# Patient Record
Sex: Female | Born: 1995
Health system: Southern US, Community
[De-identification: ages and names within clinical notes are randomized; demographics above are authoritative.]

## PROBLEM LIST (undated history)

## (undated) DIAGNOSIS — S060XAA Concussion with loss of consciousness status unknown, initial encounter: Secondary | ICD-10-CM

## (undated) DIAGNOSIS — F3131 Bipolar disorder, current episode depressed, mild: Secondary | ICD-10-CM

## (undated) DIAGNOSIS — S060X9A Concussion with loss of consciousness of unspecified duration, initial encounter: Secondary | ICD-10-CM

## (undated) HISTORY — PX: TONSILLECTOMY: SUR1361

## (undated) HISTORY — PX: ADENOIDECTOMY: SUR15

---
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
## 2013-11-01 ENCOUNTER — Other Ambulatory Visit (HOSPITAL_COMMUNITY)
Admission: RE | Admit: 2013-11-01 | Discharge: 2013-11-01 | Disposition: A | Discharge status: Home / Self Care | Payer: 59 | Source: Ambulatory Visit | Attending: Obstetrics & Gynecology | Admitting: Obstetrics & Gynecology

## 2013-11-01 DIAGNOSIS — Z113 Encounter for screening for infections with a predominantly sexual mode of transmission: Secondary | ICD-10-CM | POA: Insufficient documentation

## 2013-11-01 DIAGNOSIS — Z01419 Encounter for gynecological examination (general) (routine) without abnormal findings: Secondary | ICD-10-CM | POA: Insufficient documentation

## 2013-12-20 ENCOUNTER — Ambulatory Visit: Payer: Self-pay

## 2013-12-20 ENCOUNTER — Emergency Department: Payer: Self-pay | Admitting: Emergency Medicine

## 2013-12-20 LAB — COMPREHENSIVE METABOLIC PANEL
ALT: 36 U/L (ref 12–78)
AST: 47 U/L — AB (ref 15–37)
Albumin: 4.1 g/dL (ref 3.4–5.0)
Alkaline Phosphatase: 81 U/L
Anion Gap: 5 — ABNORMAL LOW (ref 7–16)
BUN: 11 mg/dL (ref 7–18)
Bilirubin,Total: 0.7 mg/dL (ref 0.2–1.0)
CALCIUM: 9.5 mg/dL (ref 8.5–10.1)
Chloride: 105 mmol/L (ref 98–107)
Co2: 26 mmol/L (ref 21–32)
Creatinine: 0.83 mg/dL (ref 0.60–1.30)
EGFR (African American): >60
EGFR (Non-African Amer.): >60
GLUCOSE: 90 mg/dL (ref 65–99)
Osmolality: 271 (ref 275–301)
POTASSIUM: 4 mmol/L (ref 3.5–5.1)
SODIUM: 136 mmol/L (ref 136–145)
TOTAL PROTEIN: 8.1 g/dL (ref 6.4–8.2)

## 2013-12-20 LAB — URINALYSIS, COMPLETE
Bilirubin,UR: NEGATIVE
Blood: NEGATIVE
Glucose,UR: NEGATIVE mg/dL (ref 0–75)
KETONE: NEGATIVE
NITRITE: NEGATIVE
PROTEIN: NEGATIVE
Ph: 7 (ref 4.5–8.0)
SPECIFIC GRAVITY: 1.004 (ref 1.003–1.030)
Squamous Epithelial: 3

## 2013-12-20 LAB — DRUG SCREEN, URINE

## 2013-12-20 LAB — CBC WITH DIFFERENTIAL/PLATELET
Basophil #: 0 10*3/uL (ref 0.0–0.1)
Basophil %: 0.4 %
EOS PCT: 0.3 %
Eosinophil #: 0 10*3/uL (ref 0.0–0.7)
HCT: 44.5 % (ref 35.0–47.0)
HGB: 14.7 g/dL (ref 12.0–16.0)
LYMPHS ABS: 1.3 10*3/uL (ref 1.0–3.6)
LYMPHS PCT: 14.5 %
MCH: 29.5 pg (ref 26.0–34.0)
MCHC: 33.1 g/dL (ref 32.0–36.0)
MCV: 89 fL (ref 80–100)
MONO ABS: 0.5 x10 3/mm (ref 0.2–0.9)
MONOS PCT: 6.1 %
NEUTROS ABS: 7.1 10*3/uL — AB (ref 1.4–6.5)
Neutrophil %: 78.7 %
PLATELETS: 323 10*3/uL (ref 150–440)
RBC: 4.99 10*6/uL (ref 3.80–5.20)
RDW: 13.1 % (ref 11.5–14.5)
WBC: 9 10*3/uL (ref 3.6–11.0)

## 2014-01-02 ENCOUNTER — Encounter (HOSPITAL_COMMUNITY): Payer: Self-pay | Admitting: Emergency Medicine

## 2014-01-02 ENCOUNTER — Emergency Department (HOSPITAL_COMMUNITY)
Admission: EM | Admit: 2014-01-02 | Discharge: 2014-01-02 | Disposition: A | Discharge status: Home / Self Care | Payer: 59 | Attending: Emergency Medicine | Admitting: Emergency Medicine

## 2014-01-02 ENCOUNTER — Emergency Department (HOSPITAL_COMMUNITY): Payer: 59

## 2014-01-02 DIAGNOSIS — T450X5A Adverse effect of antiallergic and antiemetic drugs, initial encounter: Secondary | ICD-10-CM | POA: Insufficient documentation

## 2014-01-02 DIAGNOSIS — T4275XA Adverse effect of unspecified antiepileptic and sedative-hypnotic drugs, initial encounter: Secondary | ICD-10-CM

## 2014-01-02 DIAGNOSIS — R51 Headache: Secondary | ICD-10-CM | POA: Insufficient documentation

## 2014-01-02 DIAGNOSIS — R1110 Vomiting, unspecified: Secondary | ICD-10-CM | POA: Insufficient documentation

## 2014-01-02 DIAGNOSIS — T50905A Adverse effect of unspecified drugs, medicaments and biological substances, initial encounter: Secondary | ICD-10-CM

## 2014-01-02 DIAGNOSIS — T426X5A Adverse effect of other antiepileptic and sedative-hypnotic drugs, initial encounter: Secondary | ICD-10-CM | POA: Insufficient documentation

## 2014-01-02 DIAGNOSIS — Z3202 Encounter for pregnancy test, result negative: Secondary | ICD-10-CM | POA: Insufficient documentation

## 2014-01-02 DIAGNOSIS — Z87828 Personal history of other (healed) physical injury and trauma: Secondary | ICD-10-CM | POA: Insufficient documentation

## 2014-01-02 DIAGNOSIS — Z79899 Other long term (current) drug therapy: Secondary | ICD-10-CM | POA: Insufficient documentation

## 2014-01-02 HISTORY — DX: Concussion with loss of consciousness of unspecified duration, initial encounter: S06.0X9A

## 2014-01-02 HISTORY — DX: Concussion with loss of consciousness status unknown, initial encounter: S06.0XAA

## 2014-01-02 LAB — URINALYSIS, ROUTINE W REFLEX MICROSCOPIC
Bilirubin Urine: NEGATIVE
Glucose, UA: NEGATIVE mg/dL
HGB URINE DIPSTICK: NEGATIVE
Ketones, ur: NEGATIVE mg/dL
Leukocytes, UA: NEGATIVE
Nitrite: NEGATIVE
PROTEIN: NEGATIVE mg/dL
Specific Gravity, Urine: 1.009 (ref 1.005–1.030)
Urobilinogen, UA: 0.2 mg/dL (ref 0.0–1.0)
pH: 6.5 (ref 5.0–8.0)

## 2014-01-02 LAB — POC URINE PREG, ED: Preg Test, Ur: NEGATIVE

## 2014-01-02 LAB — RAPID URINE DRUG SCREEN, HOSP PERFORMED
AMPHETAMINES: NOT DETECTED
BARBITURATES: NOT DETECTED
Benzodiazepines: NOT DETECTED
COCAINE: NOT DETECTED
Opiates: NOT DETECTED
TETRAHYDROCANNABINOL: NOT DETECTED

## 2014-01-02 LAB — CBC WITH DIFFERENTIAL/PLATELET
BASOS PCT: 0 % (ref 0–1)
Basophils Absolute: 0 10*3/uL (ref 0.0–0.1)
Eosinophils Absolute: 0.1 10*3/uL (ref 0.0–0.7)
Eosinophils Relative: 1 % (ref 0–5)
HEMATOCRIT: 38.8 % (ref 36.0–46.0)
HEMOGLOBIN: 13.3 g/dL (ref 12.0–15.0)
LYMPHS ABS: 2.3 10*3/uL (ref 0.7–4.0)
Lymphocytes Relative: 30 % (ref 12–46)
MCH: 30.1 pg (ref 26.0–34.0)
MCHC: 34.3 g/dL (ref 30.0–36.0)
MCV: 87.8 fL (ref 78.0–100.0)
MONO ABS: 0.6 10*3/uL (ref 0.1–1.0)
MONOS PCT: 8 % (ref 3–12)
NEUTROS ABS: 4.6 10*3/uL (ref 1.7–7.7)
NEUTROS PCT: 61 % (ref 43–77)
Platelets: 321 10*3/uL (ref 150–400)
RBC: 4.42 MIL/uL (ref 3.87–5.11)
RDW: 12.6 % (ref 11.5–15.5)
WBC: 7.6 10*3/uL (ref 4.0–10.5)

## 2014-01-02 LAB — ETHANOL: Alcohol, Ethyl (B): <11 mg/dL (ref 0–11)

## 2014-01-02 LAB — ACETAMINOPHEN LEVEL: Acetaminophen (Tylenol), Serum: <15 ug/mL (ref 10–30)

## 2014-01-02 LAB — I-STAT CHEM 8, ED
BUN: 6 mg/dL (ref 6–23)
CALCIUM ION: 1.22 mmol/L (ref 1.12–1.23)
CHLORIDE: 101 meq/L (ref 96–112)
CREATININE: 1 mg/dL (ref 0.50–1.10)
GLUCOSE: 85 mg/dL (ref 70–99)
HEMATOCRIT: 42 % (ref 36.0–46.0)
Hemoglobin: 14.3 g/dL (ref 12.0–15.0)
POTASSIUM: 4.2 meq/L (ref 3.7–5.3)
Sodium: 142 mEq/L (ref 137–147)
TCO2: 25 mmol/L (ref 0–100)

## 2014-01-02 LAB — CBG MONITORING, ED: GLUCOSE-CAPILLARY: 85 mg/dL (ref 70–99)

## 2014-01-02 LAB — SALICYLATE LEVEL: Salicylate Lvl: <2 mg/dL — ABNORMAL LOW (ref 2.8–20.0)

## 2014-01-02 MED ORDER — NAPROXEN 375 MG PO TABS: 375.0000 mg | ORAL_TABLET | Freq: Two times a day (BID) | ORAL | Status: DC

## 2014-01-02 MED ORDER — ONDANSETRON 8 MG PO TBDP: ORAL_TABLET | ORAL | Status: DC
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
## 2014-01-03 ENCOUNTER — Other Ambulatory Visit: Payer: Self-pay | Admitting: Obstetrics & Gynecology

## 2014-01-03 DIAGNOSIS — E041 Nontoxic single thyroid nodule: Secondary | ICD-10-CM

## 2014-01-04 ENCOUNTER — Ambulatory Visit
Admission: RE | Admit: 2014-01-04 | Discharge: 2014-01-04 | Disposition: A | Discharge status: Home / Self Care | Payer: 59 | Source: Ambulatory Visit | Attending: Obstetrics & Gynecology | Admitting: Obstetrics & Gynecology

## 2014-01-04 DIAGNOSIS — E041 Nontoxic single thyroid nodule: Secondary | ICD-10-CM

## 2014-05-16 ENCOUNTER — Ambulatory Visit: Payer: Self-pay | Admitting: Emergency Medicine

## 2014-05-16 LAB — URINALYSIS, COMPLETE

## 2014-05-18 LAB — URINE CULTURE

## 2014-07-02 ENCOUNTER — Ambulatory Visit: Payer: Self-pay | Admitting: Physician Assistant

## 2014-07-02 LAB — URINALYSIS, COMPLETE
Bilirubin,UR: NEGATIVE
Blood: NEGATIVE
Glucose,UR: NEGATIVE
Ketone: NEGATIVE
Nitrite: NEGATIVE
Ph: 7 (ref 5.0–8.0)
Protein: NEGATIVE
Specific Gravity: 1.02 (ref 1.000–1.030)
WBC UR: NONE SEEN /HPF (ref 0–5)

## 2014-07-04 LAB — URINE CULTURE

## 2014-11-15 ENCOUNTER — Ambulatory Visit
Admit: 2014-11-15 | Disposition: A | Discharge status: Home / Self Care | Payer: Self-pay | Attending: Internal Medicine | Admitting: Internal Medicine

## 2014-11-15 LAB — URINALYSIS, COMPLETE
BACTERIA: NEGATIVE
Bilirubin,UR: NEGATIVE
Blood: NEGATIVE
Glucose,UR: NEGATIVE
Ketone: NEGATIVE
Leukocyte Esterase: NEGATIVE
Nitrite: NEGATIVE
Ph: 6 (ref 5.0–8.0)
Protein: NEGATIVE
Specific Gravity: 1.005 (ref 1.000–1.030)
WBC UR: NONE SEEN /HPF (ref 0–5)

## 2014-11-15 LAB — CBC WITH DIFFERENTIAL/PLATELET
Basophil #: 0.1 10*3/uL (ref 0.0–0.1)
Basophil %: 0.8 %
EOS ABS: 0.2 10*3/uL (ref 0.0–0.7)
Eosinophil %: 2.1 %
HCT: 38.1 % (ref 35.0–47.0)
HGB: 13 g/dL (ref 12.0–16.0)
Lymphocyte #: 2.5 10*3/uL (ref 1.0–3.6)
Lymphocyte %: 29.5 %
MCH: 29.3 pg (ref 26.0–34.0)
MCHC: 34.1 g/dL (ref 32.0–36.0)
MCV: 86 fL (ref 80–100)
MONO ABS: 0.7 x10 3/mm (ref 0.2–0.9)
Monocyte %: 8.5 %
Neutrophil #: 5.1 10*3/uL (ref 1.4–6.5)
Neutrophil %: 59.1 %
PLATELETS: 286 10*3/uL (ref 150–440)
RBC: 4.43 10*6/uL (ref 3.80–5.20)
RDW: 12.9 % (ref 11.5–14.5)
WBC: 8.6 10*3/uL (ref 3.6–11.0)

## 2014-11-15 LAB — COMPREHENSIVE METABOLIC PANEL
ANION GAP: 8 (ref 7–16)
AST: 95 U/L — AB
Albumin: 3.9 g/dL
Alkaline Phosphatase: 54 U/L
BUN: 9 mg/dL
Bilirubin,Total: 0.6 mg/dL
CALCIUM: 8.6 mg/dL — AB
CHLORIDE: 106 mmol/L
Co2: 23 mmol/L
Creatinine: 0.63 mg/dL
EGFR (African American): >60
Glucose: 93 mg/dL
Potassium: 3.4 mmol/L — ABNORMAL LOW
SGPT (ALT): 49 U/L
SODIUM: 137 mmol/L
Total Protein: 7 g/dL

## 2014-11-15 LAB — ETHANOL: Ethanol: 12 mg/dL

## 2014-11-15 LAB — DRUG SCREEN, URINE
Amphetamines, Ur Screen: NEGATIVE
BARBITURATES, UR SCREEN: NEGATIVE
BENZODIAZEPINE, UR SCRN: NEGATIVE
CANNABINOID 50 NG, UR ~~LOC~~: POSITIVE
COCAINE METABOLITE, UR ~~LOC~~: NEGATIVE
MDMA (ECSTASY) UR SCREEN: NEGATIVE
Methadone, Ur Screen: NEGATIVE
Opiate, Ur Screen: NEGATIVE
Phencyclidine (PCP) Ur S: NEGATIVE
Tricyclic, Ur Screen: NEGATIVE

## 2014-11-15 LAB — HCG, QUANTITATIVE, PREGNANCY

## 2015-06-02 ENCOUNTER — Ambulatory Visit
Admission: EM | Admit: 2015-06-02 | Discharge: 2015-06-02 | Disposition: A | Discharge status: Home / Self Care | Payer: 59 | Attending: Family Medicine | Admitting: Family Medicine

## 2015-06-02 ENCOUNTER — Encounter: Payer: Self-pay | Admitting: Emergency Medicine

## 2015-06-02 DIAGNOSIS — L309 Dermatitis, unspecified: Secondary | ICD-10-CM

## 2015-06-02 DIAGNOSIS — L03211 Cellulitis of face: Secondary | ICD-10-CM

## 2015-06-02 MED ORDER — SULFAMETHOXAZOLE-TRIMETHOPRIM 800-160 MG PO TABS: 1.0000 | ORAL_TABLET | Freq: Two times a day (BID) | ORAL | Status: AC

## 2015-06-02 MED ORDER — CLOTRIMAZOLE-BETAMETHASONE 1-0.05 % EX CREA: 1.0000 "application " | TOPICAL_CREAM | Freq: Two times a day (BID) | CUTANEOUS | Status: AC

## 2015-06-02 MED ORDER — DIPHENHYDRAMINE HCL 2 % EX GEL: CUTANEOUS | Status: AC

## 2015-06-02 NOTE — Discharge Instructions (Signed)
Cellulitis °Cellulitis is an infection of the skin and the tissue beneath it. The infected area is usually red and tender. Cellulitis occurs most often in the arms and lower legs.  °CAUSES  °Cellulitis is caused by bacteria that enter the skin through cracks or cuts in the skin. The most common types of bacteria that cause cellulitis are staphylococci and streptococci. °SIGNS AND SYMPTOMS  °· Redness and warmth. °· Swelling. °· Tenderness or pain. °· Fever. °DIAGNOSIS  °Your health care provider can usually determine what is wrong based on a physical exam. Blood tests may also be done. °TREATMENT  °Treatment usually involves taking an antibiotic medicine. °HOME CARE INSTRUCTIONS  °· Take your antibiotic medicine as directed by your health care provider. Finish the antibiotic even if you start to feel better. °· Keep the infected arm or leg elevated to reduce swelling. °· Apply a warm cloth to the affected area up to 4 times per day to relieve pain. °· Take medicines only as directed by your health care provider. °· Keep all follow-up visits as directed by your health care provider. °SEEK MEDICAL CARE IF:  °· You notice red streaks coming from the infected area. °· Your red area gets larger or turns dark in color. °· Your bone or joint underneath the infected area becomes painful after the skin has healed. °· Your infection returns in the same area or another area. °· You notice a swollen bump in the infected area. °· You develop new symptoms. °· You have a fever. °SEEK IMMEDIATE MEDICAL CARE IF:  °· You feel very sleepy. °· You develop vomiting or diarrhea. °· You have a general ill feeling (malaise) with muscle aches and pains. °  °This information is not intended to replace advice given to you by your health care provider. Make sure you discuss any questions you have with your health care provider. °  °Document Released: 04/14/2005 Document Revised: 03/26/2015 Document Reviewed: 09/20/2011 °Elsevier Interactive  Patient Education ©2016 [employer] °Contact Dermatitis °Dermatitis is redness, soreness, and swelling (inflammation) of the skin. Contact dermatitis is a reaction to certain substances that touch the skin. There are two types of contact dermatitis:  °· Irritant contact dermatitis. This type is caused by something that irritates your skin, such as dry hands from washing them too much. This type does not require previous exposure to the substance for a reaction to occur. This type is more common. °· Allergic contact dermatitis. This type is caused by a substance that you are allergic to, such as a nickel allergy or poison ivy. This type only occurs if you have been exposed to the substance (allergen) before. Upon a repeat exposure, your body reacts to the substance. This type is less common. °CAUSES  °Many different substances can cause contact dermatitis. Irritant contact dermatitis is most commonly caused by exposure to:  °· Makeup.   °· Soaps.   °· Detergents.   °· Bleaches.   °· Acids.   °· Metal salts, such as nickel.   °Allergic contact dermatitis is most commonly caused by exposure to:  °· Poisonous plants.   °· Chemicals.   °· Jewelry.   °· Latex.   °· Medicines.   °· Preservatives in products, such as clothing.   °RISK FACTORS °This condition is more likely to develop in:  °· People who have jobs that expose them to irritants or allergens. °· People who have certain medical conditions, such as asthma or eczema.   °SYMPTOMS  °Symptoms of this condition may occur anywhere on your body where the irritant has touched   you or is touched by you. Symptoms include: °· Dryness or flaking.   °· Redness.   °· Cracks.   °· Itching.   °· Pain or a burning feeling.   °· Blisters. °· Drainage of small amounts of blood or clear fluid from skin cracks. °With allergic contact dermatitis, there may also be swelling in areas such as the eyelids, mouth, or genitals.  °DIAGNOSIS  °This condition is diagnosed with a medical  history and physical exam. A patch skin test may be performed to help determine the cause. If the condition is related to your job, you may need to see an occupational medicine specialist. °TREATMENT °Treatment for this condition includes figuring out what caused the reaction and protecting your skin from further contact. Treatment may also include:  °· Steroid creams or ointments. Oral steroid medicines may be needed in more severe cases. °· Antibiotics or antibacterial ointments, if a skin infection is present. °· Antihistamine lotion or an antihistamine taken by mouth to ease itching. °· A bandage (dressing). °HOME CARE INSTRUCTIONS °Skin Care  °· Moisturize your skin as needed.   °· Apply cool compresses to the affected areas. °· Try taking a bath with: °¨ Epsom salts. Follow the instructions on the packaging. You can get these at your local pharmacy or grocery store. °¨ Baking soda. Pour a small amount into the bath as directed by your health care provider. °¨ Colloidal oatmeal. Follow the instructions on the packaging. You can get this at your local pharmacy or grocery store. °· Try applying baking soda paste to your skin. Stir water into baking soda until it reaches a paste-like consistency. °· Do not scratch your skin. °· Bathe less frequently, such as every other day. °· Bathe in lukewarm water. Avoid using hot water. °Medicines  °· Take or apply over-the-counter and prescription medicines only as told by your health care provider.   °· If you were prescribed an antibiotic medicine, take or apply your antibiotic as told by your health care provider. Do not stop using the antibiotic even if your condition starts to improve. °General Instructions  °· Keep all follow-up visits as told by your health care provider. This is important. °· Avoid the substance that caused your reaction. If you do not know what caused it, keep a journal to try to track what caused it. Write down: °¨ What you eat. °¨ What cosmetic  products you use. °¨ What you drink. °¨ What you wear in the affected area. This includes jewelry. °· If you were given a dressing, take care of it as told by your health care provider. This includes when to change and remove it. °SEEK MEDICAL CARE IF:  °· Your condition does not improve with treatment. °· Your condition gets worse. °· You have signs of infection such as swelling, tenderness, redness, soreness, or warmth in the affected area. °· You have a fever. °· You have new symptoms. °SEEK IMMEDIATE MEDICAL CARE IF:  °· You have a severe headache, neck pain, or neck stiffness. °· You vomit. °· You feel very sleepy. °· You notice red streaks coming from the affected area. °· Your bone or joint underneath the affected area becomes painful after the skin has healed. °· The affected area turns darker. °· You have difficulty breathing. °  °This information is not intended to replace advice given to you by your health care provider. Make sure you discuss any questions you have with your health care provider. °  °Document Released: 07/02/2000 Document Revised: 03/26/2015 Document Reviewed: 11/20/2014 °Elsevier Interactive Patient   Education ©2016 [employer]. ° °

## 2015-06-02 NOTE — ED Notes (Signed)
Patient c/o tenderness and redness on the side of her nose for the past 2 days.  Patient c/o bug bites on the back of her neck for a week.

## 2015-06-04 NOTE — ED Provider Notes (Signed)
CSN: [id number]     Arrival date & time 06/02/15  1646 History   First MD Initiated Contact with Patient 06/02/15 1743     Chief Complaint  Patient presents with  . Cellulitis   (Consider location/radiation/quality/duration/timing/severity/associated sxs/prior Treatment) HPI Comments: Single caucasian female here for evaluation of purulent discharge from around nose ring left nostril got caught when taking off shirt last week has been applying OTC topicals including tea tree oil without improvement redness and pain spreading to larger area.  Pets treated for fleas thinks flea bits on back of head not responding to benadryl spray and tea tree oil. Sometimes itchy started out as a few bumps in hair now enlarging.  The history is provided by the patient.    Past Medical History  Diagnosis Date  . Concussion    Past Surgical History  Procedure Laterality Date  . Tonsillectomy    . Adenoidectomy     History reviewed. No pertinent family history. Social History  Substance Use Topics  . Smoking status: Never Smoker   . Smokeless tobacco: None  . Alcohol Use: Yes   OB History    No data available     Review of Systems  Constitutional: Negative for fever, chills, diaphoresis, activity change, appetite change and fatigue.  HENT: Negative for congestion, ear pain, mouth sores, nosebleeds, sore throat, trouble swallowing and voice change.   Eyes: Negative for pain and discharge.  Respiratory: Negative for cough and wheezing.   Cardiovascular: Negative for chest pain and leg swelling.  Gastrointestinal: Negative for nausea, vomiting, diarrhea, constipation and blood in stool.  Genitourinary: Negative for dysuria, hematuria and difficulty urinating.  Musculoskeletal: Negative for myalgias, back pain, joint swelling, arthralgias, gait problem, neck pain and neck stiffness.  Skin: Positive for color change and rash. Negative for pallor and wound.  Allergic/Immunologic: Negative for  environmental allergies and food allergies.  Neurological: Negative for headaches.  Hematological: Negative for adenopathy. Does not bruise/bleed easily.  Psychiatric/Behavioral: Negative for confusion, sleep disturbance and agitation. The patient is not nervous/anxious.     Allergies  Review of patient's allergies indicates no known allergies.  Home Medications   Prior to Admission medications   Medication Sig Start Date End Date Taking? Authorizing Provider  FLUoxetine (PROZAC) 20 MG capsule Take 20 mg by mouth daily.   Yes Historical Provider, MD  clotrimazole-betamethasone (LOTRISONE) cream Apply 1 application topically 2 (two) times daily. 06/02/15 06/08/15  Barbaraann Barthel, NP  DIPHENHYDRAMINE HCL, TOPICAL, 2 % GEL Apply to affected area TID prn itching 06/02/15   Barbaraann Barthel, NP  sulfamethoxazole-trimethoprim (BACTRIM DS,SEPTRA DS) 800-160 MG tablet Take 1 tablet by mouth 2 (two) times daily. 06/02/15 06/08/15  Barbaraann Barthel, NP   Meds Ordered and Administered this Visit  Medications - No data to display  BP 116/69 mmHg  Pulse 62  Temp(Src) 98 F (36.7 C) (Tympanic)  Resp 16  Ht  (1.626 m)  Wt 180 lb (81.647 kg)  BMI 30.88 kg/m2  SpO2 100%  LMP 05/31/2015 (Exact Date) No data found.   Physical Exam  Constitutional: She is oriented to person, place, and time. She appears well-developed and well-nourished. She is active and cooperative.  Non-toxic appearance. She does not have a sickly appearance. She does not appear ill. No distress.  HENT:  Head: Normocephalic and atraumatic.  Right Ear: Hearing, external ear and ear canal normal. A middle ear effusion is present.  Left Ear: Hearing, external ear and ear canal normal.  A middle ear effusion is present.  Nose: Sinus tenderness present. No mucosal edema, rhinorrhea, nose lacerations, nasal deformity, septal deviation or nasal septal hematoma. No epistaxis.  No foreign bodies. Right sinus exhibits no  maxillary sinus tenderness and no frontal sinus tenderness. Left sinus exhibits no maxillary sinus tenderness and no frontal sinus tenderness.    Mouth/Throat: Uvula is midline and mucous membranes are normal. Mucous membranes are not pale, not dry and not cyanotic. She does not have dentures. No oral lesions. No trismus in the jaw. Normal dentition. No dental abscesses, uvula swelling, lacerations or dental caries. No oropharyngeal exudate, posterior oropharyngeal edema, posterior oropharyngeal erythema or tonsillar abscesses.  Dry macular erythematous rash surrounding stud left nares 1cm diameter TTP crusting noted in left nares yellow  Eyes: Conjunctivae, EOM and lids are normal. Pupils are equal, round, and reactive to light. Right eye exhibits no chemosis, no discharge, no exudate and no hordeolum. No foreign body present in the right eye. Left eye exhibits no chemosis, no discharge, no exudate and no hordeolum. No foreign body present in the left eye. Right conjunctiva is not injected. Right conjunctiva has no hemorrhage. Left conjunctiva is not injected. Left conjunctiva has no hemorrhage. No scleral icterus. Right eye exhibits normal extraocular motion and no nystagmus. Left eye exhibits normal extraocular motion and no nystagmus. Right pupil is round and reactive. Left pupil is round and reactive. Pupils are equal.  Neck: Trachea normal and normal range of motion. Neck supple. No tracheal tenderness, no spinous process tenderness and no muscular tenderness present. No rigidity. No tracheal deviation, no edema, no erythema and normal range of motion present. No thyroid mass and no thyromegaly present.  Cardiovascular: Normal rate, regular rhythm, S1 normal, S2 normal, normal heart sounds and intact distal pulses.  PMI is not displaced.  Exam reveals no gallop and no friction rub.   No murmur heard. Pulmonary/Chest: Effort normal and breath sounds normal. No accessory muscle usage or stridor. No  respiratory distress. She has no decreased breath sounds. She has no wheezes. She has no rhonchi. She has no rales. She exhibits no tenderness.  Abdominal: Soft. She exhibits no distension.  Musculoskeletal: Normal range of motion. She exhibits no edema or tenderness.       Right shoulder: Normal.       Left shoulder: Normal.       Right hip: Normal.       Left hip: Normal.       Right knee: Normal.       Left knee: Normal.       Cervical back: Normal.       Right hand: Normal.       Left hand: Normal.  Lymphadenopathy:       Head (right side): No submental, no submandibular, no tonsillar, no preauricular, no posterior auricular and no occipital adenopathy present.       Head (left side): No submental, no submandibular, no tonsillar, no preauricular, no posterior auricular and no occipital adenopathy present.    She has no cervical adenopathy.       Right cervical: No superficial cervical, no deep cervical and no posterior cervical adenopathy present.      Left cervical: No superficial cervical, no deep cervical and no posterior cervical adenopathy present.  Neurological: She is alert and oriented to person, place, and time. She has normal strength. She is not disoriented. She displays no atrophy and no tremor. No cranial nerve deficit or sensory deficit. She exhibits normal  muscle tone. She displays no seizure activity. Coordination and gait normal. GCS eye subscore is 4. GCS verbal subscore is 5. GCS motor subscore is 6.  Skin: Skin is warm, dry and intact. Rash noted. No abrasion, no bruising, no burn, no ecchymosis, no laceration, no lesion, no petechiae and no purpura noted. Rash is macular and maculopapular. Rash is not papular, not nodular, not pustular, not vesicular and not urticarial. She is not diaphoretic. There is erythema. No cyanosis. No pallor. Nails show no clubbing.     Occiput in hairline macularpapular rash with moderate scaling fine grouped less than 3cm in diameter  slightly TTP dry; left nares macular erythematous rash 1cm diameter  Psychiatric: She has a normal mood and affect. Her speech is normal and behavior is normal. Judgment and thought content normal. Cognition and memory are normal.  Nursing note and vitals reviewed.   ED Course  Procedures (including critical care time)  Labs Review Labs Reviewed - No data to display  Imaging Review No results found.   MDM   1. Cellulitis, face   2. Dermatitis    Exitcare handout on skin infection given to patient. Left nares with mild cellulitis not responding to OT C topical treatment.  Stop tea tree oil start bactrim DS po BID x 7 days.  RTC if worsening erythema, pain, purulent discharge, fever should be improving within 48 hours on antibiotics.  Wash sheets and towels in hot water with bleach.  Patient verbalized understanding, agreed with plan of care and had no further questions at this time.    May continue benadryl spray topical prn itching.  Avoid scratching area.  See if improvement with bactrim.  Stop tea tree oil as area dry/scaley, papular.  Consider emollient.  No other portion of body affected.  patient reported pets recently treated for fleas.  If no improvement with these measures than start lotrisone BID to affected area x 7 days.  If no improvement follow up for re-evaluation by PCM.  Rx given.  Medication as directed.  Symptomatic therapy suggested.  Warm to cool water soaks and/or oatmeal baths. Have home checked for fleas in carpets/furniture and fumigate if indicated. Call or return to clinic as needed if these symptoms worsen or fail to improve as anticipated.  Exitcare handout on dermatitis given to patient.  Patient verbalized agreement and understanding of treatment plan and had no further questions at this time.   P2:  Avoidance and hand washing.    Barbaraann Barthelina A Hiawatha Dressel, NP 06/04/15 [phone]
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
## 2015-06-19 ENCOUNTER — Ambulatory Visit
Admission: EM | Admit: 2015-06-19 | Discharge: 2015-06-19 | Disposition: A | Discharge status: Home / Self Care | Payer: 59 | Attending: Emergency Medicine | Admitting: Emergency Medicine

## 2015-06-19 DIAGNOSIS — J340 Abscess, furuncle and carbuncle of nose: Secondary | ICD-10-CM | POA: Diagnosis not present

## 2015-06-19 HISTORY — DX: Bipolar disorder, current episode depressed, mild: F31.31

## 2015-06-19 MED ORDER — SULFAMETHOXAZOLE-TRIMETHOPRIM 800-160 MG PO TABS: 1.0000 | ORAL_TABLET | Freq: Two times a day (BID) | ORAL | Status: AC

## 2015-06-19 MED ORDER — MUPIROCIN 2 % EX OINT: TOPICAL_OINTMENT | CUTANEOUS | Status: AC

## 2015-06-19 NOTE — ED Notes (Signed)
Nose peircing 2 months ago. Caught post on clothing 2 weeks later and site became infected. Seen here and put on antibiotic. Travelled and forgot to take them. Got home 1 1/2 weeks ago. 2 days ago infection returned

## 2015-06-19 NOTE — Discharge Instructions (Signed)

## 2015-06-19 NOTE — ED Provider Notes (Signed)
[facility]  ____________________________________________  Time seen: Approximately 7:04 PM  I have reviewed the triage vital signs and the nursing notes.   HISTORY  Chief Complaint Abscess   HPI Wanda Jensen is a 19 y.o. female presents for the complaints of swelling and drainage at nose piercing site of the last 2-3 days.. Patient reports that 2 weeks ago she was taking off a piece of clothing and it snagged her left nare piercing. States at that time,  an infection and abscess started.  States that she was seen in urgent care and treated with oral antibiotics. Patient states that the oral antibiotics improved the situation however states that she then went out of town and forgot her antibiotics. States that she then did not take complete course of antibiotics. Patient reports that she took 4-5 days of the medication.  States gradual onset of swelling and redness directly around nasal piercing again over last 2-3 days. Denies recent injury. States occasionally some discharge. States minimally tender directly a piercing site at 2 out of 10. Denies other pain. Denies drainage into the nasal cavity. Denies fever, facial swelling, vision changes difficulty breathing through nose or other complaints.  Reports continues to eat and drink well.   Past Medical History  Diagnosis Date  . Concussion   . Bipolar 1 disorder, depressed, mild (HCC)     There are no active problems to display for this patient.   Past Surgical History  Procedure Laterality Date  . Tonsillectomy    . Adenoidectomy      Current Outpatient Rx  Name  Route  Sig  Dispense  Refill  . FLUoxetine (PROZAC) 20 MG capsule   Oral   Take 20 mg by mouth daily.         .            Last menstrual: 1.5 weeks ago. Denies chance of pregnancy.  Allergies Review of patient's allergies indicates no known allergies.  History reviewed. No pertinent family history.  Social History Social History   Substance Use Topics  . Smoking status: Never Smoker   . Smokeless tobacco: None  . Alcohol Use: Yes     Comment: socially    Review of Systems Constitutional: No fever/chills Eyes: No visual changes. ENT: No sore throat. Cardiovascular: Denies chest pain. Respiratory: Denies shortness of breath. Gastrointestinal: No abdominal pain.  No nausea, no vomiting.  No diarrhea.  No constipation. Genitourinary: Negative for dysuria. Musculoskeletal: Negative for back pain. Skin: Negative for rash. Neurological: Negative for headaches, focal weakness or numbness.  10-point ROS otherwise negative.  ____________________________________________   PHYSICAL EXAM:  VITAL SIGNS: ED Triage Vitals  Enc Vitals Group     BP 06/19/15 1846 136/86 mmHg     Pulse Rate 06/19/15 1846 64     Resp 06/19/15 1846 16     Temp 06/19/15 1846 98.1 F (36.7 C)     Temp Source 06/19/15 1846 Tympanic     SpO2 06/19/15 1846 100 %     Weight 06/19/15 1846 180 lb (81.647 kg)     Height 06/19/15 1846  (1.626 m)     Head Cir --      Peak Flow --      Pain Score --      Pain Loc --      Pain Edu? --      Excl. in GC? --     Constitutional: Alert and oriented. Well appearing and in no acute  distress. Eyes: Conjunctivae are normal. PERRL. EOMI. Head: Atraumatic. Nontender.  Ears: no erythema, normal TMs bilaterally.   Nose: No congestion/rhinnorhea.  External left nostril instead piercing present with less than half a centimeter area of swelling and mild erythema, no exudate or drainage, no fluctuance, no surrounding erythema, no induration. No visualized internal swelling or exudate.  Mouth/Throat: Mucous membranes are moist.  Oropharynx non-erythematous. No tonsillar swelling or exudate. No uvular shift or deviation. Neck: No stridor.  No cervical spine tenderness to palpation. Hematological/Lymphatic/Immunilogical: No cervical lymphadenopathy. Cardiovascular: Normal rate, regular rhythm. Grossly  normal heart sounds.  Good peripheral circulation. Respiratory: Normal respiratory effort.  No retractions. Lungs CTAB. Gastrointestinal: Soft and nontender.  Musculoskeletal: No lower or upper extremity tenderness nor edema.   Neurologic:  Normal speech and language. No gross focal neurologic deficits are appreciated. No gait instability. Skin:  Skin is warm, dry and intact. No rash noted. Except: Left nare stud piercing present, with small area less than 1 cm rounded and raised, nonfluctuant swelling, no drainage expressed, mildly erythematous, mildy TTP at piercing site only. No visualized or palpated internal nare swelling or abscess. Nares patent bilaterally. No surrounding erythema. Psychiatric: Mood and affect are normal. Speech and behavior are normal.  ____________________________________________   LABS (all labs ordered are listed, but only abnormal results are displayed)  Labs Reviewed - No data to display ____________________________________________   INITIAL IMPRESSION / ASSESSMENT AND PLAN / ED COURSE  Pertinent labs & imaging results that were available during my care of the patient were reviewed by me and considered in my medical decision making (see chart for details).  Patient very well-appearing. Patient presents for small area of swelling and redness directly around left nasal piercing site. Reports piercing has been present times several months but has not had any problems until last few weeks. Reports took antibiotics initially which did help but states when out of town and then did not complete antibiotics. States issue then returned shortly after. Left nare piercing with small area less than 1 cm rounded and raised, nonfluctuant swelling, no drainage expressed. No I&D indicated. Will treat with oral Bactrim as well as topical Bactroban. Counseled patient regarding cleaning as well as consideration of nasal piercing removal for continued complaints and symptoms.  Discussed her follow-up and return parameters.  Discussed follow up with Primary care physician this week. Discussed follow up and return parameters including no resolution or any worsening concerns. Patient verbalized understanding and agreed to plan.   ____________________________________________   FINAL CLINICAL IMPRESSION(S) / ED DIAGNOSES  Final diagnoses:  Abscess of external nose       Renford DillsLindsey Flem Enderle, NP 06/19/15 2113  Renford DillsLindsey Nikos Anglemyer, NP 06/19/15 2114

## 2015-07-03 IMAGING — US US SOFT TISSUE HEAD/NECK
1 series · 14 of 25 positions shown · non-contrast
Comparison: None.

CLINICAL DATA: Thyroid nodule reported on outside imaging

EXAM:
THYROID ULTRASOUND
TECHNIQUE: Ultrasound examination of the thyroid gland and adjacent soft
tissues was performed.

[Series 1: us soft tissue head/neck · 0.07mm/px · 14 of 44 slices shown]
[im 1/44]
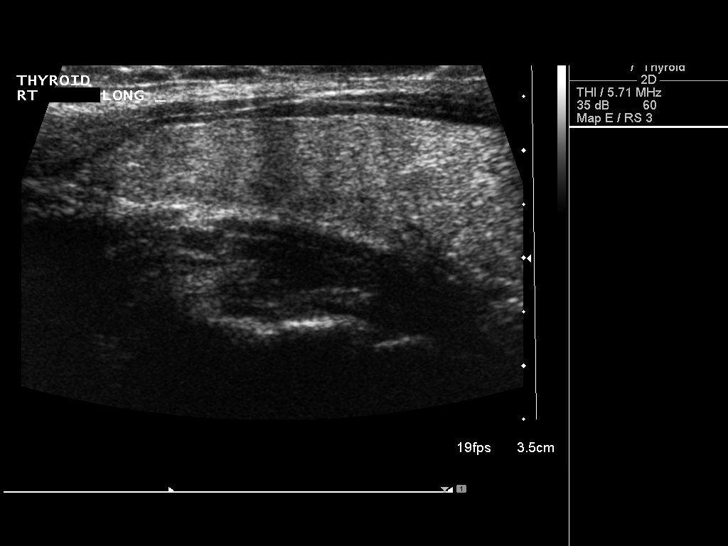
[im 4/44]
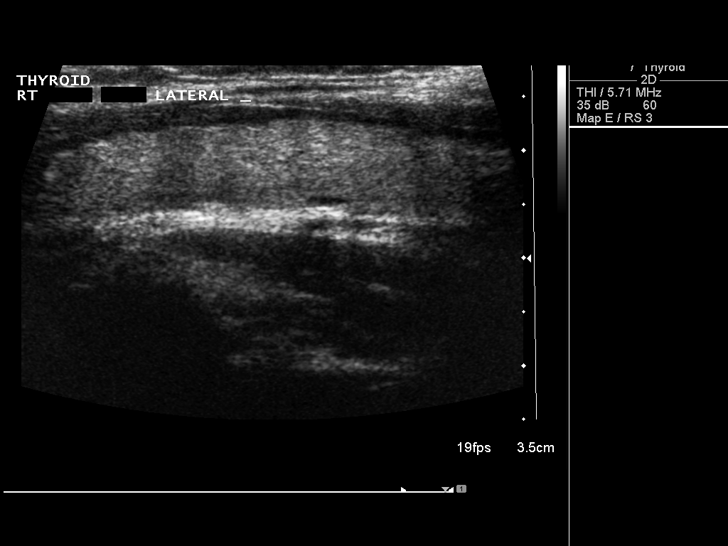
[im 8/44]
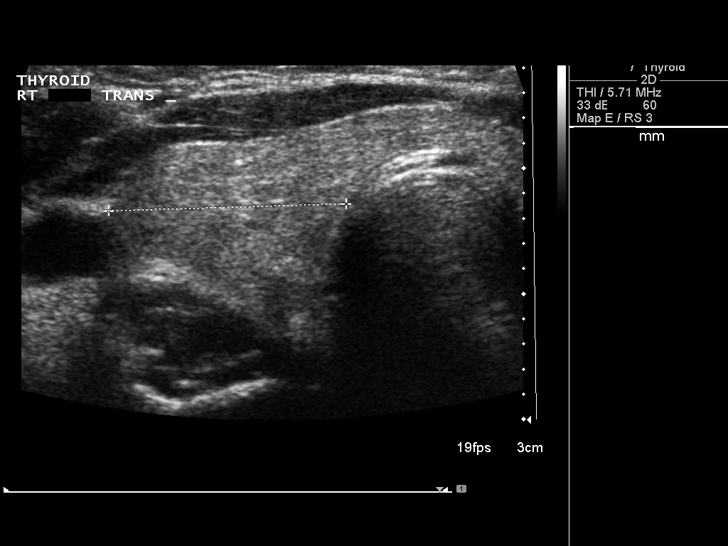
[im 11/44]
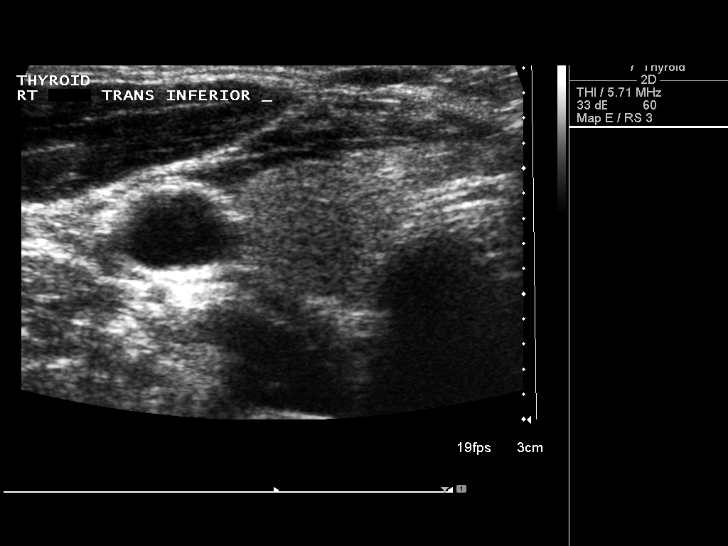
[im 15/44]
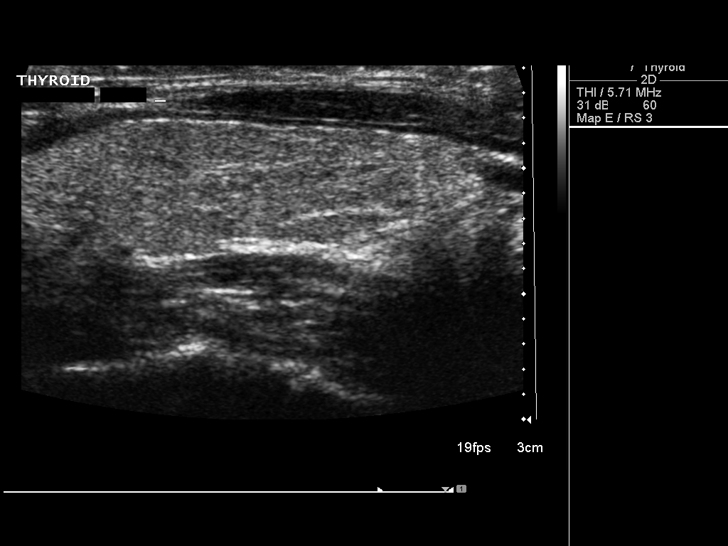
[im 17/44]
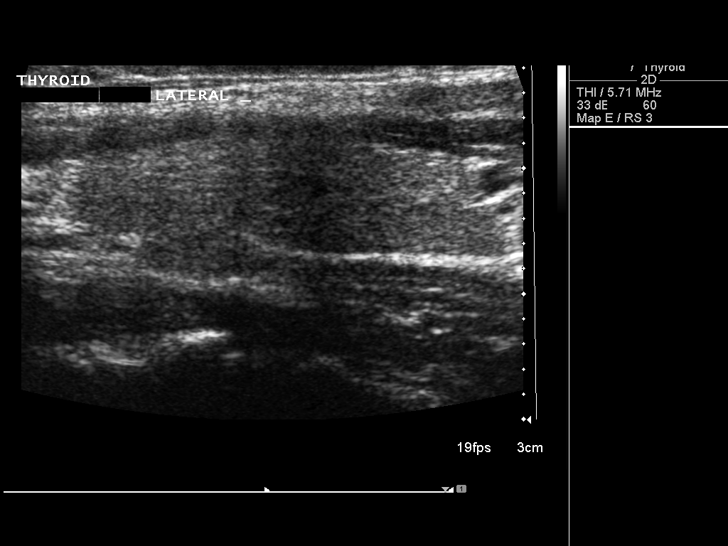
[im 20/44]
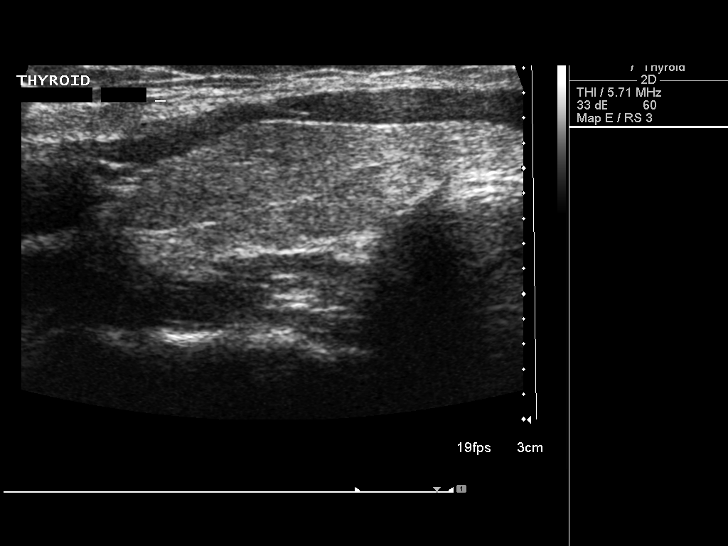
[im 24/44]
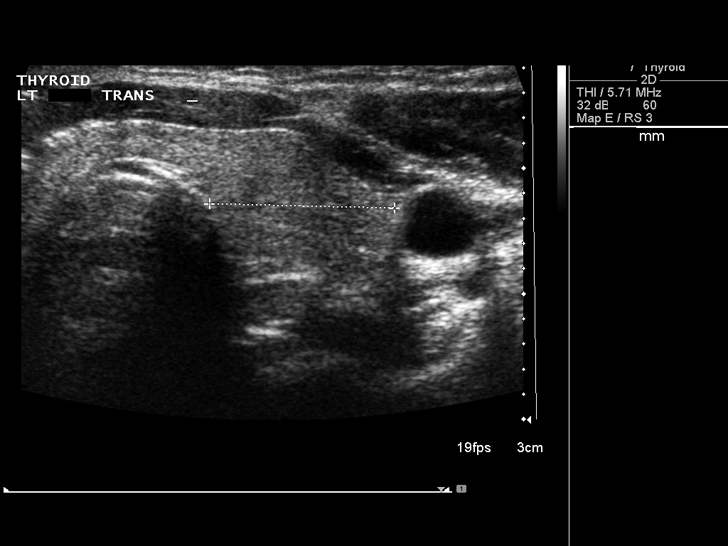
[im 27/44]
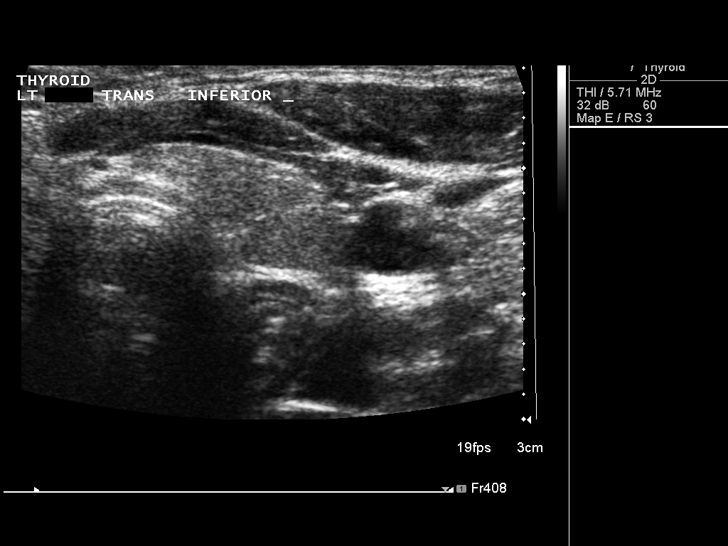
[im 29/44]
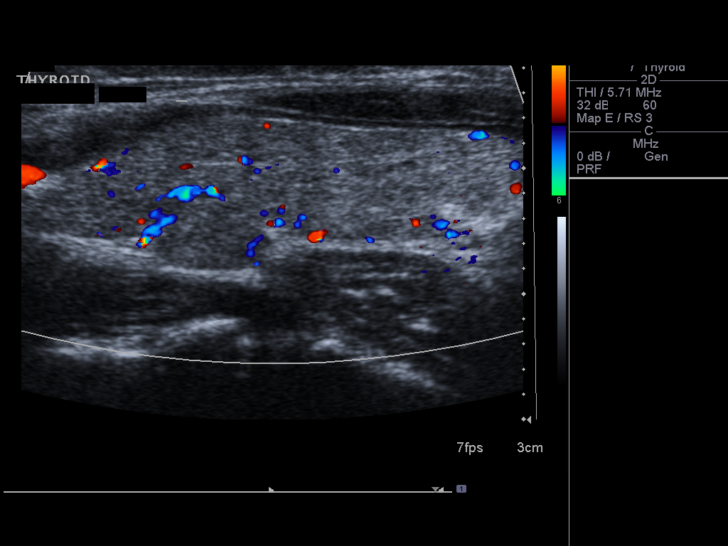
[im 33/44]
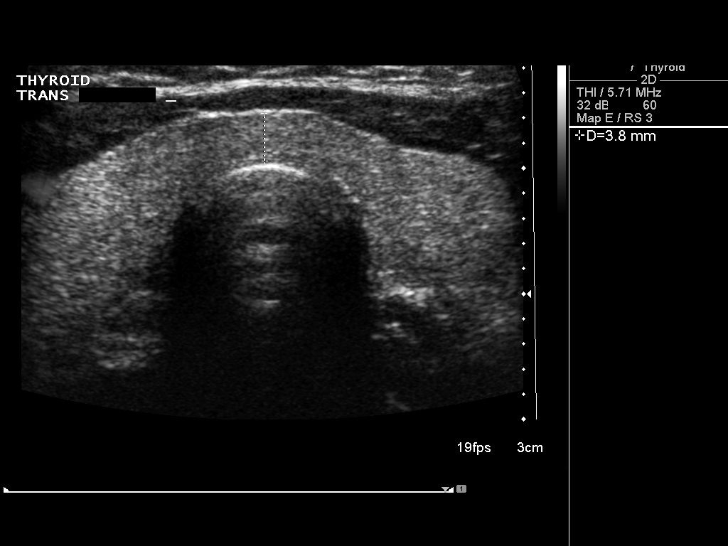
[im 36/44]
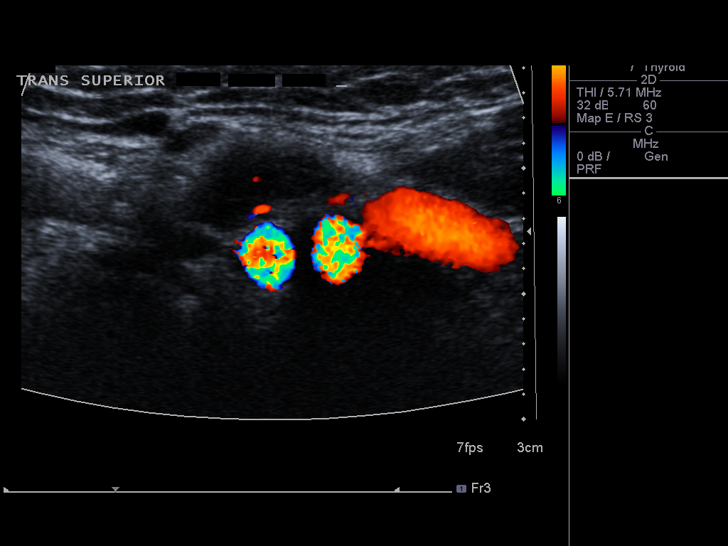
[im 40/44]
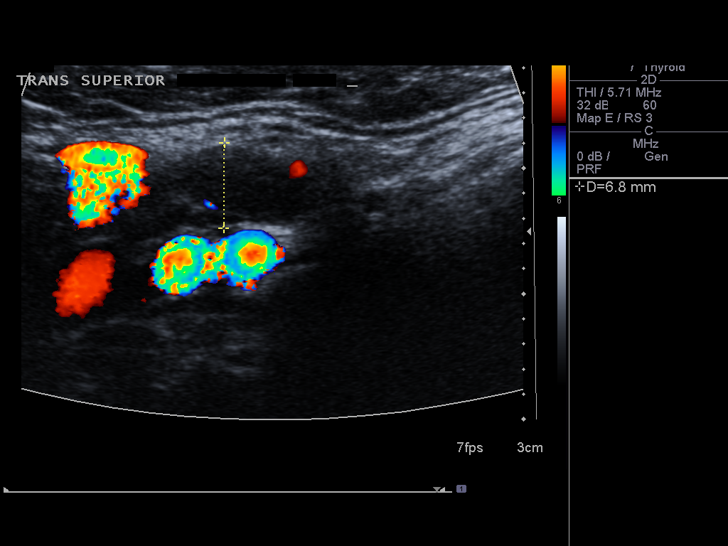
[im 44/44]
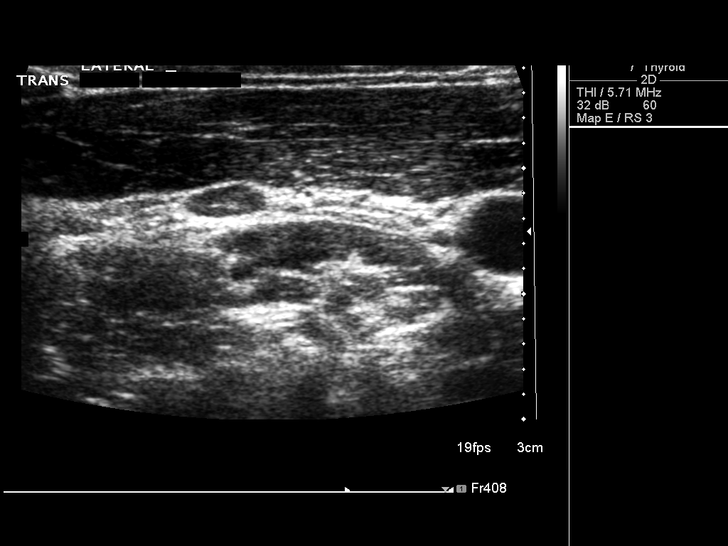

[14 of 25 positions shown; findings below may reference images not displayed]

FINDINGS: Right thyroid lobe

Measurements: 53 x 14 x 19 mm.  No nodules visualized.

Left thyroid lobe

Measurements: 39 x 12 x 15 mm.  No nodules visualized.

Isthmus

Thickness: 4 mm.  No nodules visualized.

Lymphadenopathy

None visualized.
IMPRESSION: 1. Homogeneous thyroid without nodule or other focal lesion.

## 2015-07-17 ENCOUNTER — Encounter: Payer: Self-pay | Admitting: *Deleted

## 2015-07-17 ENCOUNTER — Ambulatory Visit (INDEPENDENT_AMBULATORY_CARE_PROVIDER_SITE_OTHER): Payer: 59

## 2015-07-17 ENCOUNTER — Ambulatory Visit
Admission: EM | Admit: 2015-07-17 | Discharge: 2015-07-17 | Disposition: A | Discharge status: Home / Self Care | Payer: 59 | Attending: Family Medicine | Admitting: Family Medicine

## 2015-07-17 DIAGNOSIS — M62830 Muscle spasm of back: Secondary | ICD-10-CM | POA: Diagnosis not present

## 2015-07-17 MED ORDER — METAXALONE 800 MG PO TABS: 800.0000 mg | ORAL_TABLET | Freq: Three times a day (TID) | ORAL | Status: AC

## 2015-07-17 MED ORDER — MELOXICAM 15 MG PO TABS: 15.0000 mg | ORAL_TABLET | Freq: Every day | ORAL | Status: AC

## 2015-07-17 MED ORDER — KETOROLAC TROMETHAMINE 60 MG/2ML IM SOLN
60.0000 mg | Freq: Once | INTRAMUSCULAR | Status: AC
  Administered 2015-07-17: 60 mg via INTRAMUSCULAR
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
## 2015-07-17 NOTE — Discharge Instructions (Signed)
Back Injury Prevention Back injuries can be very painful. They can also be difficult to heal. After having one back injury, you are more likely to injure your back again. It is important to learn how to avoid injuring or re-injuring your back. The following tips can help you to prevent a back injury. WHAT SHOULD I KNOW ABOUT PHYSICAL FITNESS?  Exercise for 30 minutes per day on most days of the week or as told by your doctor. Make sure to:  Do aerobic exercises, such as walking, jogging, biking, or swimming.  Do exercises that increase balance and strength, such as tai chi and yoga.  Do stretching exercises. This helps with flexibility.  Try to develop strong belly (abdominal) muscles. Your belly muscles help to support your back.  Stay at a healthy weight. This helps to decrease your risk of a back injury. WHAT SHOULD I KNOW ABOUT MY DIET?  Talk with your doctor about your overall diet. Take supplements and vitamins only as told by your doctor.  Talk with your doctor about how much calcium and vitamin D you need each day. These nutrients help to prevent weakening of the bones (osteoporosis).  Include good sources of calcium in your diet, such as:  Dairy products.  Green leafy vegetables.  Products that have had calcium added to them (fortified).  Include good sources of vitamin D in your diet, such as:  Milk.  Foods that have had vitamin D added to them. WHAT SHOULD I KNOW ABOUT MY POSTURE?  Sit up straight and stand up straight. Avoid leaning forward when you sit or hunching over when you stand.  Choose chairs that have good low-back (lumbar) support.  If you work at a desk, sit close to it so you do not need to lean over. Keep your chin tucked in. Keep your neck drawn back. Keep your elbows bent so your arms look like the letter "L" (right angle).  Sit high and close to the steering wheel when you drive. Add a low-back support to your car seat, if needed.  Avoid sitting  or standing in one position for very long. Take breaks to get up, stretch, and walk around at least one time every hour. Take breaks every hour if you are driving for long periods of time.  Sleep on your side with your knees slightly bent, or sleep on your back with a pillow under your knees. Do not lie on the front of your body to sleep. WHAT SHOULD I KNOW ABOUT LIFTING, TWISTING, AND REACHING Lifting and Heavy Lifting  Avoid heavy lifting, especially lifting over and over again. If you must do heavy lifting:  Stretch before lifting.  Work slowly.  Rest between lifts.  Use a tool such as a cart or a dolly to move objects if one is available.  Make several small trips instead of carrying one heavy load.  Ask for help when you need it, especially when moving big objects.  Follow these steps when lifting:  Stand with your feet shoulder-width apart.  Get as close to the object as you can. Do not pick up a heavy object that is far from your body.  Use handles or lifting straps if they are available.  Bend at your knees. Squat down, but keep your heels off the floor.  Keep your shoulders back. Keep your chin tucked in. Keep your back straight.  Lift the object slowly while you tighten the muscles in your legs, belly, and butt. Keep the object  as close to the center of your body as possible.  Follow these steps when putting down a heavy load:  Stand with your feet shoulder-width apart.  Lower the object slowly while you tighten the muscles in your legs, belly, and butt. Keep the object as close to the center of your body as possible.  Keep your shoulders back. Keep your chin tucked in. Keep your back straight.  Bend at your knees. Squat down, but keep your heels off the floor.  Use handles or lifting straps if they are available. Twisting and Reaching  Avoid lifting heavy objects above your waist.  Do not twist at your waist while you are lifting or carrying a load. If  you need to turn, move your feet.  Do not bend over without bending at your knees.  Avoid reaching over your head, across a table, or for an object on a high surface.  WHAT ARE SOME OTHER TIPS?  Avoid wet floors and icy ground. Keep sidewalks clear of ice to prevent falls.   Do not sleep on a mattress that is too soft or too hard.   Keep items that you use often within easy reach.   Put heavier objects on shelves at waist level, and put lighter objects on lower or higher shelves.  Find ways to lower your stress, such as:  Exercise.  Massage.  Relaxation techniques.  Talk with your doctor if you feel anxious or depressed. These conditions can make back pain worse.  Wear flat heel shoes with cushioned soles.  Avoid making quick (sudden) movements.  Use both shoulder straps when carrying a backpack.  Do not use any tobacco products, including cigarettes, chewing tobacco, or electronic cigarettes. If you need help quitting, ask your doctor.   This information is not intended to replace advice given to you by your health care provider. Make sure you discuss any questions you have with your health care provider.   Document Released: 12/22/2007 Document Revised: 11/19/2014 Document Reviewed: 07/09/2014 Elsevier Interactive Patient Education 2016 [employer]  Muscle Cramps and Spasms Muscle cramps and spasms are when muscles tighten by themselves. They usually get better within minutes. Muscle cramps are painful. They are usually stronger and last longer than muscle spasms. Muscle spasms may or may not be painful. They can last a few seconds or much longer. HOME CARE  Drink enough fluid to keep your pee (urine) clear or pale yellow.  Massage, stretch, and relax the muscle.  Use a warm towel, heating pad, or warm shower water on tight muscles.  Place ice on the muscle if it is tender or in pain.  Put ice in a plastic bag.  Place a towel between your skin and the  bag.  Leave the ice on for 15-20 minutes, 03-04 times a day.  Only take medicine as told by your doctor. GET HELP RIGHT AWAY IF:  Your cramps or spasms get worse, happen more often, or do not get better with time. MAKE SURE YOU:  Understand these instructions.  Will watch your condition.  Will get help right away if you are not doing well or get worse.   This information is not intended to replace advice given to you by your health care provider. Make sure you discuss any questions you have with your health care provider.   Document Released: 06/17/2008 Document Revised: 10/30/2012 Document Reviewed: 06/21/2012 Elsevier Interactive Patient Education [employer].

## 2015-07-17 NOTE — ED Notes (Signed)
Patient went to brush her teeth this AM and her right shoulder became painful with spasms. Patient does have a history of lower right back and hip pain.

## 2015-08-19 DIAGNOSIS — F1221 Cannabis dependence, in remission: Secondary | ICD-10-CM | POA: Diagnosis not present

## 2015-08-19 DIAGNOSIS — F331 Major depressive disorder, recurrent, moderate: Secondary | ICD-10-CM | POA: Diagnosis not present

## 2016-11-29 ENCOUNTER — Other Ambulatory Visit: Payer: Self-pay | Admitting: Obstetrics & Gynecology

## 2016-11-29 ENCOUNTER — Other Ambulatory Visit (HOSPITAL_COMMUNITY)
Admission: RE | Admit: 2016-11-29 | Discharge: 2016-11-29 | Disposition: A | Discharge status: Home / Self Care | Payer: Managed Care, Other (non HMO) | Source: Ambulatory Visit | Attending: Obstetrics & Gynecology | Admitting: Obstetrics & Gynecology

## 2016-11-29 DIAGNOSIS — Z1151 Encounter for screening for human papillomavirus (HPV): Secondary | ICD-10-CM | POA: Insufficient documentation

## 2016-11-29 DIAGNOSIS — Z01411 Encounter for gynecological examination (general) (routine) with abnormal findings: Secondary | ICD-10-CM | POA: Insufficient documentation

## 2016-12-02 LAB — CYTOLOGY - PAP: HPV: DETECTED — AB

## 2017-01-04 ENCOUNTER — Other Ambulatory Visit: Payer: Self-pay | Admitting: Obstetrics & Gynecology

## 2017-08-26 ENCOUNTER — Ambulatory Visit (INDEPENDENT_AMBULATORY_CARE_PROVIDER_SITE_OTHER): Payer: Self-pay | Admitting: Nurse Practitioner

## 2017-08-26 VITALS — BP 119/69 | HR 82 | Temp 99.6°F | Wt 173.0 lb

## 2017-08-26 DIAGNOSIS — R300 Dysuria: Secondary | ICD-10-CM

## 2017-08-26 DIAGNOSIS — R399 Unspecified symptoms and signs involving the genitourinary system: Secondary | ICD-10-CM

## 2017-08-26 LAB — POCT URINALYSIS DIPSTICK
BILIRUBIN UA: NEGATIVE
Blood, UA: NEGATIVE
Glucose, UA: NEGATIVE
KETONES UA: NEGATIVE
Leukocytes, UA: NEGATIVE
Nitrite, UA: NEGATIVE
PH UA: 7 (ref 5.0–8.0)
PROTEIN UA: NEGATIVE
SPEC GRAV UA: 1.02 (ref 1.010–1.025)
UROBILINOGEN UA: 0.2 U/dL

## 2017-08-26 MED ORDER — SULFAMETHOXAZOLE-TRIMETHOPRIM 800-160 MG PO TABS: 1.0000 | ORAL_TABLET | Freq: Two times a day (BID) | ORAL | 0 refills | Status: AC

## 2017-08-26 NOTE — Progress Notes (Signed)
Subjective:    Wanda Jensen is a 22 y.o. female who complains of dysuria, frequency and urgency for 3 days.  Patient also complains of fever and stomach ache. Patient denies back pain, congestion, cough, headache, sorethroat and vaginal discharge.  Patient does not have a history of recurrent UTI.  Patient does not have a history of pyelonephritis. The following portions of the patient's history were reviewed and updated as appropriate: allergies, current medications and past medical history. Review of Systems Constitutional: positive for fevers and low-grade, negative for anorexia, chills, fatigue, malaise and sweats Ears, nose, mouth, throat, and face: negative Respiratory: negative Cardiovascular: negative Gastrointestinal: positive for abdominal pain, negative for change in bowel habits, constipation, diarrhea, nausea and vomiting Genitourinary:positive for dysuria and frequency, negative for vaginal discharge, decreased stream, hematuria and urinary incontinence    Objective:    BP 119/69   Pulse 82   Temp 99.6 F (37.6 C)   Wt 173 lb (78.5 kg)   SpO2 96%   BMI 29.70 kg/m  General: alert, cooperative and no distress  Abdomen: soft, nondistended, normal bowel sounds, tenderness mild in the lower abdomen, without guarding and without rebound in the lower abdomen  Back: back muscles are full ROM, spine nontender, CVA tenderness absent  GU: defer exam   Laboratory:  Urine dipstick shows negative for all components.   Micro exam: not done.    Assessment:    Urinary Tract Infection Symptoms    Plan: Plan:    1. Medications: TMP/SMX 2. Maintain adequate hydration 3. Follow up if symptoms not improving, and prn.   4. Discussed with patient urinalysis results.  Results indicate no infection, but patient's symptoms are congruent with UTI.  Placed patient on Bactrim for 3 days because unable to culture urine.  Instructed patient to follow up with PCP if symptoms do not  improve, will need urine culture at that time.

## 2017-08-26 NOTE — Patient Instructions (Addendum)

## 2017-08-30 ENCOUNTER — Telehealth: Payer: Self-pay | Admitting: Emergency Medicine

## 2017-08-30 NOTE — Telephone Encounter (Signed)
Follow up call for visit on 08/26/2017 LM

## 2017-10-05 DIAGNOSIS — Z3041 Encounter for surveillance of contraceptive pills: Secondary | ICD-10-CM | POA: Diagnosis not present

## 2017-10-05 DIAGNOSIS — Z30012 Encounter for prescription of emergency contraception: Secondary | ICD-10-CM | POA: Diagnosis not present

## 2017-10-05 DIAGNOSIS — Z309 Encounter for contraceptive management, unspecified: Secondary | ICD-10-CM | POA: Diagnosis not present

## 2018-01-18 DIAGNOSIS — Z113 Encounter for screening for infections with a predominantly sexual mode of transmission: Secondary | ICD-10-CM | POA: Diagnosis not present

## 2018-01-18 DIAGNOSIS — Z01419 Encounter for gynecological examination (general) (routine) without abnormal findings: Secondary | ICD-10-CM | POA: Diagnosis not present

## 2018-01-18 DIAGNOSIS — R87612 Low grade squamous intraepithelial lesion on cytologic smear of cervix (LGSIL): Secondary | ICD-10-CM | POA: Diagnosis not present

## 2018-04-05 DIAGNOSIS — R112 Nausea with vomiting, unspecified: Secondary | ICD-10-CM | POA: Diagnosis not present

## 2018-04-05 DIAGNOSIS — Z6830 Body mass index (BMI) 30.0-30.9, adult: Secondary | ICD-10-CM | POA: Diagnosis not present

## 2018-11-01 DIAGNOSIS — R079 Chest pain, unspecified: Secondary | ICD-10-CM | POA: Diagnosis not present

## 2018-11-01 DIAGNOSIS — R0602 Shortness of breath: Secondary | ICD-10-CM | POA: Diagnosis not present

## 2018-11-01 DIAGNOSIS — R439 Unspecified disturbances of smell and taste: Secondary | ICD-10-CM | POA: Diagnosis not present

## 2018-11-01 DIAGNOSIS — R05 Cough: Secondary | ICD-10-CM | POA: Diagnosis not present

## 2018-11-01 DIAGNOSIS — Z20828 Contact with and (suspected) exposure to other viral communicable diseases: Secondary | ICD-10-CM | POA: Diagnosis not present

## 2019-03-01 DIAGNOSIS — Z20828 Contact with and (suspected) exposure to other viral communicable diseases: Secondary | ICD-10-CM | POA: Diagnosis not present

## 2019-03-20 DIAGNOSIS — Z01419 Encounter for gynecological examination (general) (routine) without abnormal findings: Secondary | ICD-10-CM | POA: Diagnosis not present

## 2019-03-20 DIAGNOSIS — Z3041 Encounter for surveillance of contraceptive pills: Secondary | ICD-10-CM | POA: Diagnosis not present

## 2019-05-27 DIAGNOSIS — Z20828 Contact with and (suspected) exposure to other viral communicable diseases: Secondary | ICD-10-CM | POA: Diagnosis not present

## 2019-07-01 DIAGNOSIS — Z20828 Contact with and (suspected) exposure to other viral communicable diseases: Secondary | ICD-10-CM | POA: Diagnosis not present
